# Patient Record
Sex: Male | Born: 1995 | Race: Black or African American | Hispanic: No | Marital: Single | Smoking: Never smoker
Health system: Southern US, Community
[De-identification: ages and names within clinical notes are randomized; demographics above are authoritative.]

## PROBLEM LIST (undated history)

## (undated) DIAGNOSIS — L853 Xerosis cutis: Secondary | ICD-10-CM

## (undated) HISTORY — DX: Xerosis cutis: L85.3

## (undated) HISTORY — PX: TONSILLECTOMY: SUR1361

---
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
## 2017-11-30 ENCOUNTER — Encounter: Payer: Self-pay | Admitting: Pediatrics
